# Patient Record
Sex: Male | Born: 2000 | Race: White | Hispanic: No | Marital: Single | Smoking: Never smoker
Health system: Southern US, Community
[De-identification: ages and names within clinical notes are randomized; demographics above are authoritative.]

## PROBLEM LIST (undated history)

## (undated) DIAGNOSIS — T7840XA Allergy, unspecified, initial encounter: Secondary | ICD-10-CM

## (undated) DIAGNOSIS — M2341 Loose body in knee, right knee: Secondary | ICD-10-CM

## (undated) HISTORY — PX: ADENOIDECTOMY: SUR15

## (undated) HISTORY — PX: OTHER SURGICAL HISTORY: SHX169

---
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
## 2019-01-12 DIAGNOSIS — Z20828 Contact with and (suspected) exposure to other viral communicable diseases: Secondary | ICD-10-CM | POA: Diagnosis not present

## 2019-05-31 ENCOUNTER — Other Ambulatory Visit: Payer: Self-pay

## 2019-05-31 ENCOUNTER — Emergency Department (HOSPITAL_COMMUNITY)
Admission: EM | Admit: 2019-05-31 | Discharge: 2019-06-01 | Disposition: A | Discharge status: Home / Self Care | Payer: 59 | Attending: Emergency Medicine | Admitting: Emergency Medicine

## 2019-05-31 ENCOUNTER — Encounter (HOSPITAL_COMMUNITY): Payer: Self-pay

## 2019-05-31 DIAGNOSIS — Y999 Unspecified external cause status: Secondary | ICD-10-CM | POA: Insufficient documentation

## 2019-05-31 DIAGNOSIS — S50812A Abrasion of left forearm, initial encounter: Secondary | ICD-10-CM | POA: Diagnosis not present

## 2019-05-31 DIAGNOSIS — Y92410 Unspecified street and highway as the place of occurrence of the external cause: Secondary | ICD-10-CM | POA: Diagnosis not present

## 2019-05-31 DIAGNOSIS — Y93I9 Activity, other involving external motion: Secondary | ICD-10-CM | POA: Insufficient documentation

## 2019-05-31 DIAGNOSIS — Z79899 Other long term (current) drug therapy: Secondary | ICD-10-CM | POA: Diagnosis not present

## 2019-05-31 DIAGNOSIS — S59912A Unspecified injury of left forearm, initial encounter: Secondary | ICD-10-CM | POA: Diagnosis present
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
## 2019-06-01 NOTE — Discharge Instructions (Addendum)
Ibuprofen 600 mg every 6 hours as needed for pain.  Rest.  Apply ice to affected areas 20 minutes every 2 hours while awake for the next 2 days.  Return to the ER if symptoms significantly worsen or change.
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
